# Patient Record
Sex: Male | Born: 1950 | Hispanic: No | Smoking: Never smoker
Health system: Southern US, Community
[De-identification: ages and names within clinical notes are randomized; demographics above are authoritative.]

## PROBLEM LIST (undated history)

## (undated) DIAGNOSIS — F319 Bipolar disorder, unspecified: Secondary | ICD-10-CM

## (undated) DIAGNOSIS — G4733 Obstructive sleep apnea (adult) (pediatric): Secondary | ICD-10-CM

## (undated) DIAGNOSIS — E785 Hyperlipidemia, unspecified: Secondary | ICD-10-CM

## (undated) DIAGNOSIS — R251 Tremor, unspecified: Secondary | ICD-10-CM

## (undated) DIAGNOSIS — E079 Disorder of thyroid, unspecified: Secondary | ICD-10-CM

## (undated) DIAGNOSIS — I10 Essential (primary) hypertension: Secondary | ICD-10-CM

## (undated) HISTORY — DX: Obstructive sleep apnea (adult) (pediatric): G47.33

## (undated) HISTORY — DX: Essential (primary) hypertension: I10

## (undated) HISTORY — DX: Hyperlipidemia, unspecified: E78.5

## (undated) HISTORY — PX: OTOPLASATY: SHX1485

## (undated) HISTORY — DX: Tremor, unspecified: R25.1

## (undated) HISTORY — DX: Bipolar disorder, unspecified: F31.9

## (undated) HISTORY — PX: NASAL SEPTUM SURGERY: SHX37

## (undated) HISTORY — PX: CATARACT EXTRACTION: SUR2

---

## 1969-04-10 HISTORY — PX: HEMORRHOID SURGERY: SHX153

## 2005-12-26 ENCOUNTER — Emergency Department (HOSPITAL_COMMUNITY): Admission: EM | Admit: 2005-12-26 | Discharge: 2005-12-26 | Payer: Self-pay | Admitting: Family Medicine

## 2013-03-08 ENCOUNTER — Other Ambulatory Visit: Payer: Self-pay | Admitting: Family

## 2013-03-08 DIAGNOSIS — M25512 Pain in left shoulder: Secondary | ICD-10-CM

## 2013-03-10 ENCOUNTER — Ambulatory Visit
Admission: RE | Admit: 2013-03-10 | Discharge: 2013-03-10 | Disposition: A | Discharge status: Home / Self Care | Payer: BC Managed Care – PPO | Source: Ambulatory Visit | Attending: Family | Admitting: Family

## 2013-03-10 DIAGNOSIS — M25512 Pain in left shoulder: Secondary | ICD-10-CM

## 2013-11-16 ENCOUNTER — Encounter: Payer: Self-pay | Admitting: *Deleted

## 2013-12-18 IMAGING — CT CT SHOULDER*L* W/O CM
3 of 4 series · 6 of 14 positions shown, 7 images · non-contrast
Comparison: None.

CLINICAL DATA: Shoulder pain with limited range of motion since
falling 4-6 weeks ago.

CT OF THE LEFT SHOULDER WITHOUT CONTRAST
TECHNIQUE: Multidetector CT imaging was performed according to the
standard protocol. Multiplanar CT image reconstructions were also
generated.

[Series 3: shoulder/standard · axial · 0.47mm/px · z∈[-2,+43]mm · 2 of 55 slices shown, 3 images]
[im 19/55  soft-tissue]
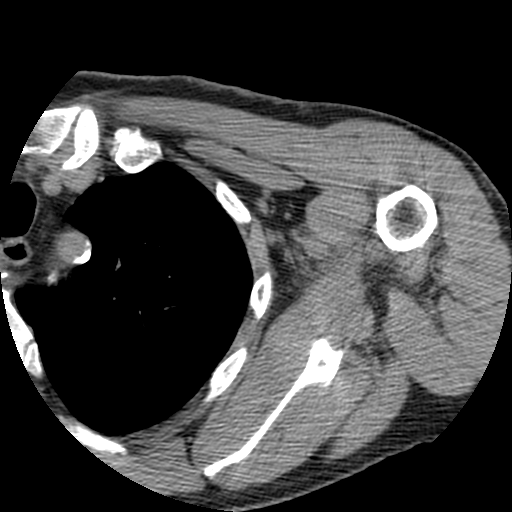
[im 19/55  bone]
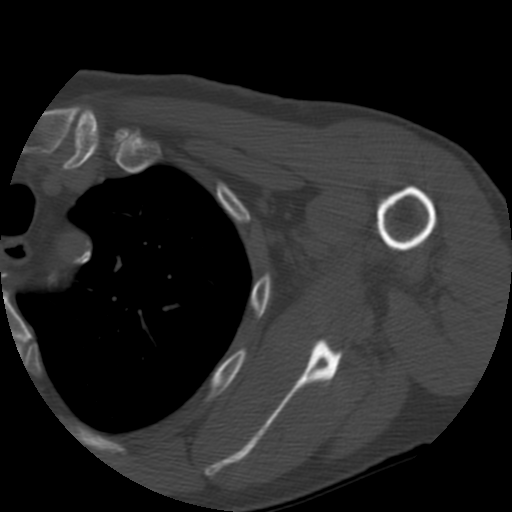
[im 37/55  bone]
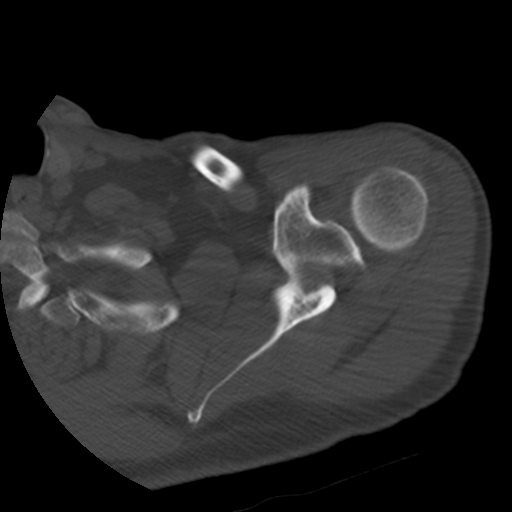

[Series 102: shoulder std · axial · 0.47mm/px · z∈[-2,+43]mm · 2 of 55 slices shown]
[im 19/55  soft-tissue]
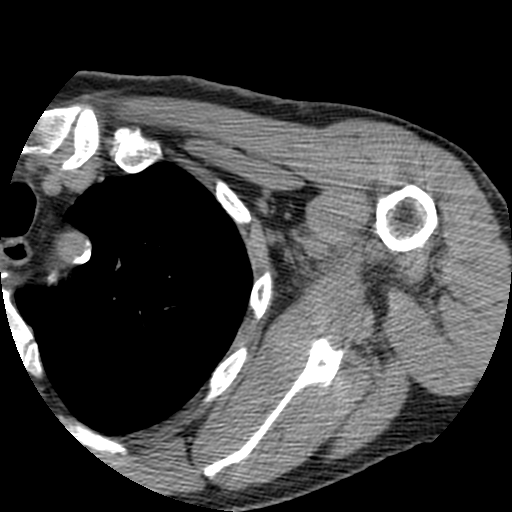
[im 37/55  soft-tissue]
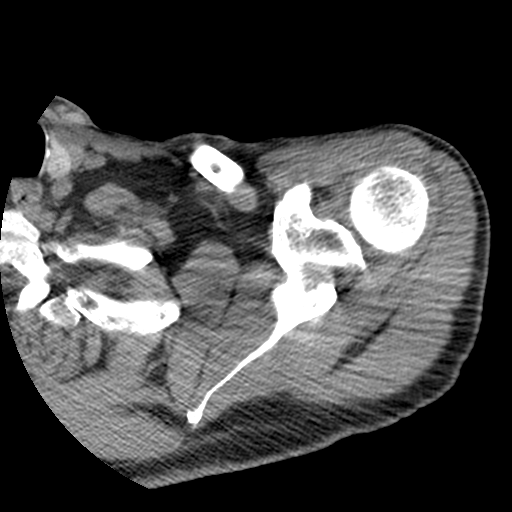

[Series 106: sag soft · coronal · 0.47mm/px · 2 of 62 slices shown]
[im 21/62  soft-tissue]
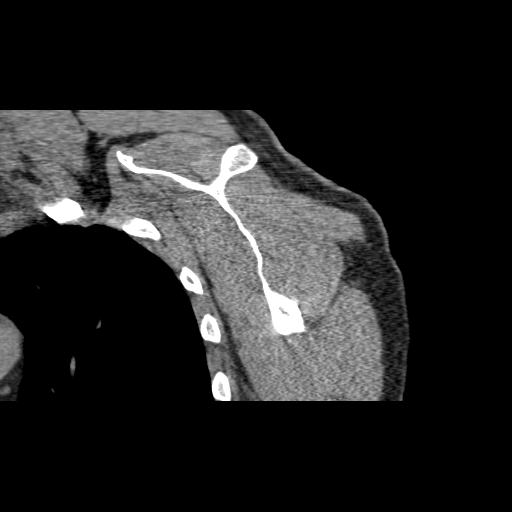
[im 41/62  soft-tissue]
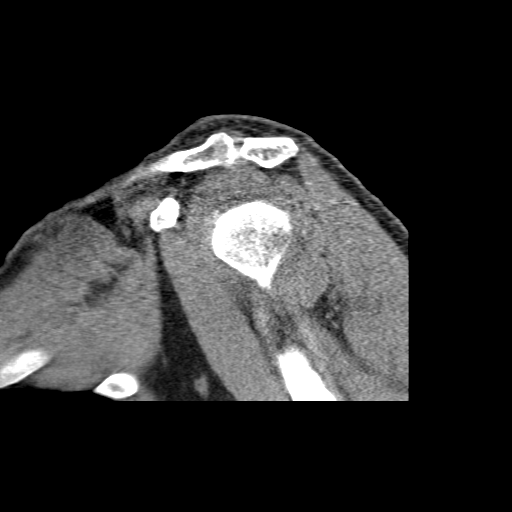

[6 of 14 positions shown; findings below may reference images not displayed]

FINDINGS: There is no evidence of acute fracture or dislocation of
the proximal left humerus, left scapula or left clavicle.  Mild
acromioclavicular degenerative changes are present.  The acromion
has a type 1 shape. The glenohumeral joint appears normal.

No focal atrophy of the rotator cuff musculature is demonstrated.
The cuff itself is suboptimally evaluated by CT.  No periarticular
inflammatory changes are identified.

The visualized left chest wall appears normal.  Emphysematous
changes are noted within the left lung.
IMPRESSION: 1.  No evidence of acute left shoulder fracture or dislocation.
2.  Mild acromioclavicular degenerative changes.
3.  No focal soft tissue abnormalities identified by CT.

## 2018-09-20 ENCOUNTER — Ambulatory Visit (HOSPITAL_COMMUNITY)
Admission: EM | Admit: 2018-09-20 | Discharge: 2018-09-20 | Disposition: A | Discharge status: Home / Self Care | Payer: Medicare Other | Attending: Family Medicine | Admitting: Family Medicine

## 2018-09-20 ENCOUNTER — Ambulatory Visit (INDEPENDENT_AMBULATORY_CARE_PROVIDER_SITE_OTHER): Payer: Medicare Other

## 2018-09-20 ENCOUNTER — Other Ambulatory Visit: Payer: Self-pay

## 2018-09-20 ENCOUNTER — Encounter (HOSPITAL_COMMUNITY): Payer: Self-pay | Admitting: Emergency Medicine

## 2018-09-20 DIAGNOSIS — M109 Gout, unspecified: Secondary | ICD-10-CM

## 2018-09-20 DIAGNOSIS — M79674 Pain in right toe(s): Secondary | ICD-10-CM | POA: Diagnosis not present

## 2018-09-20 HISTORY — DX: Disorder of thyroid, unspecified: E07.9

## 2018-09-20 MED ORDER — PREDNISONE 20 MG PO TABS: 20.0000 mg | ORAL_TABLET | Freq: Two times a day (BID) | ORAL | 0 refills | Status: AC

## 2018-09-20 NOTE — ED Provider Notes (Signed)
[facility]   [id number] 09/20/18 Arrival Time: 0802  CC: Right great toe  SUBJECTIVE: History from: patient. Stephen Zhang is a 68 y.o. male complains of right great toe pain that began a few days ago.  Reports heavy item falling on foot a few weeks ago.  Increased pain, swelling, and redness to the right great toe within the last few days.  Localizes the pain to the right great toe.  Describes the pain as intermittent and throbbing in character.  Has tried aleve with temporary relief.  Symptoms are made worse at night.  Denies similar symptoms in the past.  Denies fever, chills, ecchymosis, weakness, numbness and tingling.      ROS: As per HPI.  Past Medical History:  Diagnosis Date  . Bipolar disorder (HCC)   . Essential hypertension, benign   . OSA (obstructive sleep apnea)   . Other and unspecified hyperlipidemia   . Thyroid disease   . Tremor    Past Surgical History:  Procedure Laterality Date  . CATARACT EXTRACTION    . HEMORRHOID SURGERY  1970's  . NASAL SEPTUM SURGERY    . OTOPLASATY     Allergies  Allergen Reactions  . Abilify [Aripiprazole]    No current facility-administered medications on file prior to encounter.    Current Outpatient Medications on File Prior to Encounter  Medication Sig Dispense Refill  . atorvastatin (LIPITOR) 10 MG tablet Take 10 mg by mouth daily.    . Cholecalciferol (VITAMIN D3 PO) Take by mouth.    . Ferrous Sulfate (IRON PO) Take by mouth.    . levothyroxine (SYNTHROID, LEVOTHROID) 100 MCG tablet Take 20 mcg by mouth daily before breakfast.    . telmisartan (MICARDIS) 40 MG tablet Take 40 mg by mouth daily.    Marland Kitchen olmesartan (BENICAR) 20 MG tablet Take 20 mg by mouth daily.    . tadalafil (CIALIS) 10 MG tablet Take 10 mg by mouth daily as needed for erectile dysfunction.    . triamterene-hydrochlorothiazide (MAXZIDE-25) 37.5-25 MG per tablet Take 1 tablet by mouth daily.     Social History   Socioeconomic History  .  Marital status: Unknown    Spouse name: Not on file  . Number of children: Not on file  . Years of education: Not on file  . Highest education level: Not on file  Occupational History  . Not on file  Social Needs  . Financial resource strain: Not on file  . Food insecurity:    Worry: Not on file    Inability: Not on file  . Transportation needs:    Medical: Not on file    Non-medical: Not on file  Tobacco Use  . Smoking status: Never Smoker  Substance and Sexual Activity  . Alcohol use: Yes  . Drug use: No  . Sexual activity: Not on file  Lifestyle  . Physical activity:    Days per week: Not on file    Minutes per session: Not on file  . Stress: Not on file  Relationships  . Social connections:    Talks on phone: Not on file    Gets together: Not on file    Attends religious service: Not on file    Active member of club or organization: Not on file    Attends meetings of clubs or organizations: Not on file    Relationship status: Not on file  . Intimate partner violence:    Fear of current or ex partner: Not on  file    Emotionally abused: Not on file    Physically abused: Not on file    Forced sexual activity: Not on file  Other Topics Concern  . Not on file  Social History Narrative  . Not on file   Family History  Problem Relation Age of Onset  . Non-Hodgkin's lymphoma Father     OBJECTIVE:  Vitals:   09/20/18 0831  BP: 122/70  Pulse: 65  Resp: 18  Temp: 97.7 F (36.5 C)  TempSrc: Oral  SpO2: 100%    General appearance: Alert; in no acute distress.  Head: NCAT Lungs: Normal respiratory effort CV: Dorsalis pedis pulse intact; cap refill < 2 secs Musculoskeletal: Right great toe Inspection: Obvious erythema and swelling localized to RT MTP and PIP joint of great toe Palpation: TTP over MTP joint ROM: LROM Strength: deferred, due to patient's discomfort Skin: warm and dry Neurologic: Ambulates without difficulty Psychological: alert and  cooperative; normal mood and affect  DIAGNOSTIC STUDIES:  Dg Toe Great Right  Result Date: 09/20/2018 CLINICAL DATA:  Solid object fell on toe EXAM: RIGHT FIRST TOE: 3 V COMPARISON:  None. FINDINGS: Frontal, oblique, and lateral views obtained. No fracture or dislocation. There is mild generalized soft tissue swelling. Joint spaces appear normal. No erosive change. IMPRESSION: Soft tissue swelling. No evident fracture or dislocation. No appreciable arthropathy. Electronically Signed   By: Bretta Bang III M.D.   On: 09/20/2018 09:08     ASSESSMENT & PLAN:  1. 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 predniSONE (DELTASONE) 20 MG tablet    Sig: Take 1 tablet (20 mg total) by mouth 2 (two) times daily with a meal for 5 days.    Dispense:  10 tablet    Refill:  0    Order Specific Question:   Supervising Provider    Answer:   Eustace Moore [[id number]]   X-rays did not show fracture or dislocation Rest, ice, elevate Prescribed prednisone.  Take as directed and to completion Follow up with orthopedist and/ or PCP if symptoms persists Return or go to the ED if you have any new or worsening symptoms fever, chills, redness, swelling, nausea, vomiting, etc...  Reviewed expectations re: course of current medical issues. Questions answered. Outlined signs and symptoms indicating need for more acute intervention. Patient verbalized understanding. After Visit Summary given.    Rennis Harding, PA-C 09/20/18 [phone]

## 2018-09-20 NOTE — ED Triage Notes (Signed)
Patient remembers having a heavy item fall off a shelf and land on foot.  Intermittent pain since then, over the last two days pain has increased significantly.  Pain and swelling.

## 2018-09-20 NOTE — Discharge Instructions (Addendum)
X-rays did not show fracture or dislocation Rest, ice, elevate Prescribed prednisone.  Take as directed and to completion Follow up with orthopedist and/ or PCP if symptoms persists Follow up with PCP for further evaluation and management Return or go to the ED if you have any new or worsening symptoms

## 2019-06-30 IMAGING — DX DG TOE GREAT 2+V*R*
3 series · 3 of 3 positions shown · non-contrast
Comparison: None.

CLINICAL DATA: Solid object fell on toe

EXAM:
RIGHT FIRST TOE: 3 V

[toe ap]
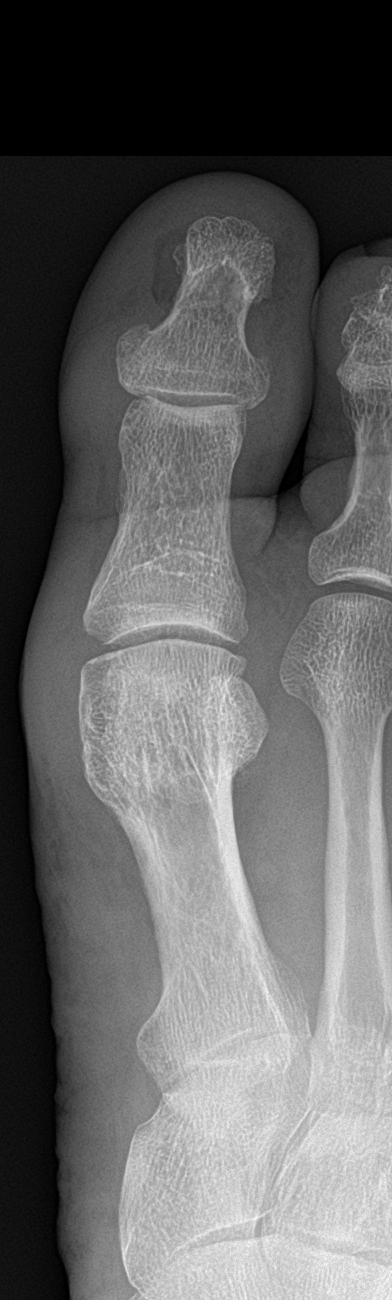

[toe obl]
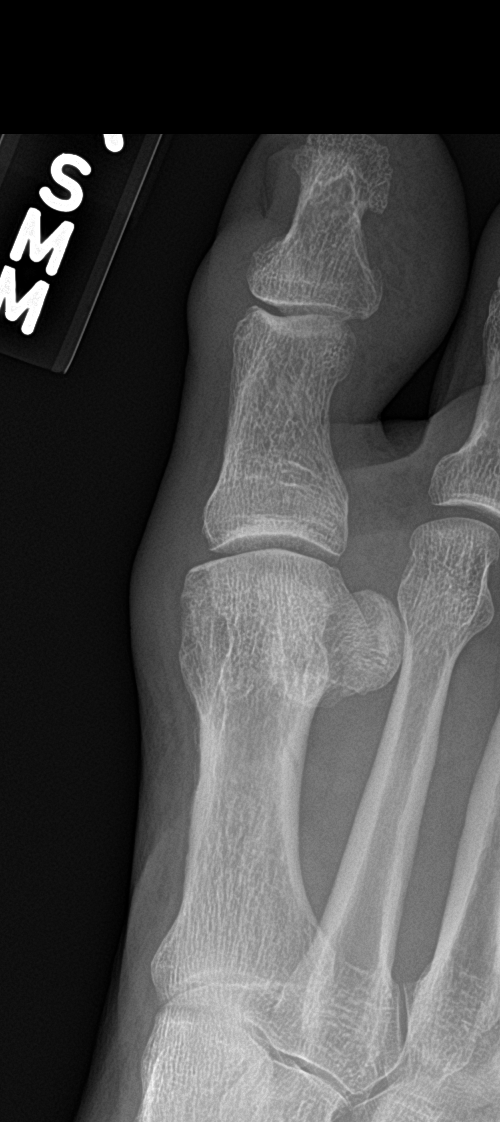

[toe lat]
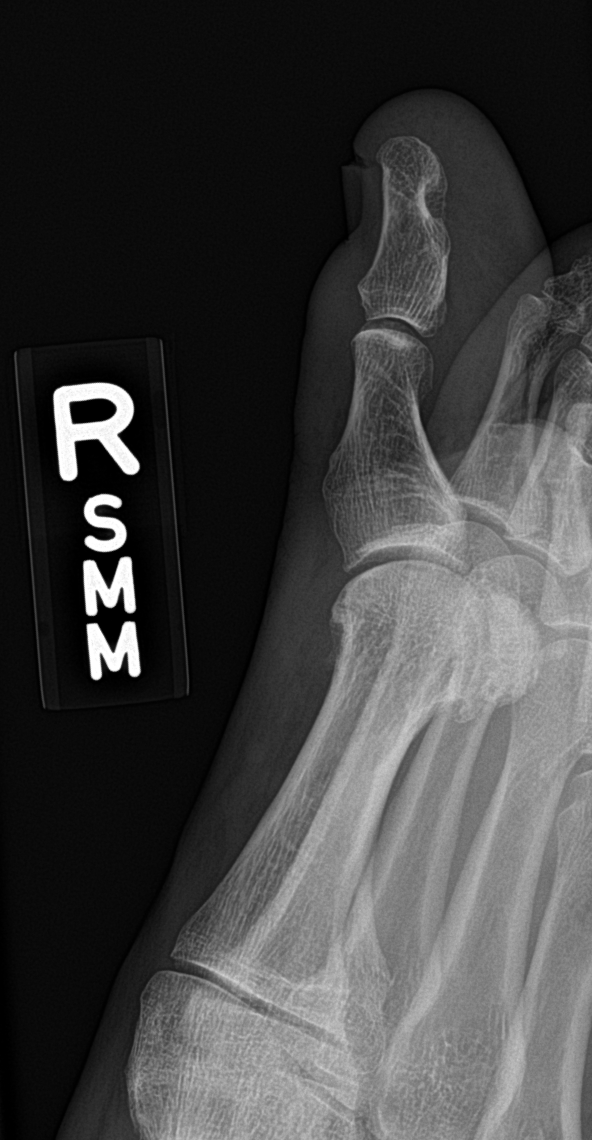

[3 of 3 positions shown; findings below may reference images not displayed]

FINDINGS: Frontal, oblique, and lateral views obtained. No fracture or
dislocation. There is mild generalized soft tissue swelling. Joint
spaces appear normal. No erosive change.
IMPRESSION: Soft tissue swelling. No evident fracture or dislocation. No
appreciable arthropathy.

## 2020-06-02 ENCOUNTER — Encounter: Payer: Self-pay | Admitting: Emergency Medicine

## 2020-06-02 ENCOUNTER — Other Ambulatory Visit: Payer: Self-pay

## 2020-06-02 ENCOUNTER — Ambulatory Visit
Admission: EM | Admit: 2020-06-02 | Discharge: 2020-06-02 | Disposition: A | Discharge status: Home / Self Care | Payer: Medicare Other | Attending: Emergency Medicine | Admitting: Emergency Medicine

## 2020-06-02 DIAGNOSIS — R195 Other fecal abnormalities: Secondary | ICD-10-CM

## 2020-06-02 MED ORDER — LOPERAMIDE HCL 2 MG PO CAPS: 2.0000 mg | ORAL_CAPSULE | Freq: Four times a day (QID) | ORAL | 0 refills | Status: AC | PRN

## 2020-06-02 NOTE — Discharge Instructions (Addendum)
Keep track of stool, bring to specialist. Take immodium as discussed. Read attached handout on food recommendations. Drink plenty of water: avoid sugary beverages.

## 2020-06-02 NOTE — ED Triage Notes (Signed)
Pt here for diarrhea x 1 week that is watery

## 2020-06-02 NOTE — ED Provider Notes (Addendum)
[facility]    CSN: [id number] Arrival date & time: 06/02/20  7829      History   Chief Complaint Chief Complaint  Patient presents with  . Diarrhea    HPI Stephen Zhang is a 69 y.o. male  The medical history as below presenting for loose stools since Monday.  No hematochezia, melena, mucus in stool.  Reporting 2-3 stools daily without rectal pain, hemorrhoids.  No urinary symptoms, fever, arthralgias or myalgias.  No change in diet, lifestyle, medications.  Has not tried thing for this.  Reports good oral intake without vomiting.   Past Medical History:  Diagnosis Date  . Bipolar disorder (HCC)   . Essential hypertension, benign   . OSA (obstructive sleep apnea)   . Other and unspecified hyperlipidemia   . Thyroid disease   . Tremor     There are no problems to display for this patient.   Past Surgical History:  Procedure Laterality Date  . CATARACT EXTRACTION    . HEMORRHOID SURGERY  1970's  . NASAL SEPTUM SURGERY    . OTOPLASATY         Home Medications    Prior to Admission medications   Medication Sig Start Date End Date Taking? Authorizing Provider  atorvastatin (LIPITOR) 10 MG tablet Take 10 mg by mouth daily.    [provider]  Cholecalciferol (VITAMIN D3 PO) Take by mouth.    [provider]  Ferrous Sulfate (IRON PO) Take by mouth.    [provider]  levothyroxine (SYNTHROID, LEVOTHROID) 100 MCG tablet Take 20 mcg by mouth daily before breakfast.    [provider]  loperamide (IMODIUM) 2 MG capsule Take 1 capsule (2 mg total) by mouth 4 (four) times daily as needed for diarrhea or loose stools. 06/02/20   Hall-Potvin, [place], PA-C  olmesartan (BENICAR) 20 MG tablet Take 20 mg by mouth daily.    [provider]  tadalafil (CIALIS) 10 MG tablet Take 10 mg by mouth daily as needed for erectile dysfunction.    [provider]  telmisartan (MICARDIS) 40 MG tablet Take 40 mg by mouth  daily.    [provider]  triamterene-hydrochlorothiazide (MAXZIDE-25) 37.5-25 MG per tablet Take 1 tablet by mouth daily.    [provider]    Family History Family History  Problem Relation Age of Onset  . Non-Hodgkin's lymphoma Father     Social History Social History   Tobacco Use  . Smoking status: Never Smoker  Substance Use Topics  . Alcohol use: Yes  . Drug use: No     Allergies   Abilify [aripiprazole]   Review of Systems Review of Systems  Constitutional: Negative for fever.  Respiratory: Negative for cough and shortness of breath.   Cardiovascular: Negative for chest pain and palpitations.  Gastrointestinal: Positive for diarrhea. Negative for abdominal pain, blood in stool, constipation, heartburn, melena, nausea and vomiting.  Genitourinary: Negative for dysuria.  Neurological: Negative for weakness.  All other systems reviewed and are negative.    Physical Exam Triage Vital Signs ED Triage Vitals  Enc Vitals Group     BP      Pulse      Resp      Temp      Temp src      SpO2      Weight      Height      Head Circumference      Peak Flow  Pain Score      Pain Loc      Pain Edu?      Excl. in GC?    No data found.  Updated Vital Signs BP 115/70 (BP Location: Left Arm)   Pulse 63   Temp 97.8 F (36.6 C) (Oral)   Resp 18   SpO2 97%   Visual Acuity Right Eye Distance:   Left Eye Distance:   Bilateral Distance:    Right Eye Near:   Left Eye Near:    Bilateral Near:     Physical Exam Constitutional:      General: He is not in acute distress. HENT:     Head: Normocephalic and atraumatic.  Eyes:     General: No scleral icterus.    Pupils: Pupils are equal, round, and reactive to light.  Cardiovascular:     Rate and Rhythm: Normal rate.  Pulmonary:     Effort: Pulmonary effort is normal. No respiratory distress.     Breath sounds: No wheezing.  Abdominal:     General: Abdomen is flat. Bowel sounds are  normal. There is no distension or abdominal bruit.     Palpations: Abdomen is soft. There is no hepatomegaly or splenomegaly.     Tenderness: There is no abdominal tenderness. There is no guarding or rebound. Negative signs include Murphy's sign, Rovsing's sign and McBurney's sign.     Hernia: No hernia is present.  Skin:    Coloration: Skin is not jaundiced or pale.  Neurological:     Mental Status: He is alert and oriented to person, place, and time.      UC Treatments / Results  Labs (all labs ordered are listed, but only abnormal results are displayed) Labs Reviewed - No data to display  EKG   Radiology No results found.  Procedures Procedures (including critical care time)  Medications Ordered in UC Medications - No data to display  Initial Impression / Assessment and Plan / UC Course  I have reviewed the triage vital signs and the nursing notes.  Pertinent labs & imaging results that were available during my care of the patient were reviewed by me and considered in my medical decision making (see chart for details).     Well-hydrated and hemodynamically stable in office.  Having fewer than 6 BMs daily and has not tried conservative treatment.  Does not meet up-to-date guidelines for stool study.  Will start Imodium, follow-up with GI as needed.  Return precautions discussed, pt verbalized understanding and is agreeable to plan. Final Clinical Impressions(s) / UC Diagnoses   Final diagnoses:  Watery stools     Discharge Instructions     Keep track of stool, bring to specialist. Take immodium as discussed. Read attached handout on food recommendations. Drink plenty of water: avoid sugary beverages.    ED Prescriptions    Medication Sig Dispense Auth. Provider   loperamide (IMODIUM) 2 MG capsule Take 1 capsule (2 mg total) by mouth 4 (four) times daily as needed for diarrhea or loose stools. 12 capsule Hall-Potvin, [place], PA-C     PDMP not reviewed this  encounter.     Hall-Potvin, [place], [place] 06/02/20 1034

## 2021-08-05 ENCOUNTER — Emergency Department (HOSPITAL_COMMUNITY)
Admission: EM | Admit: 2021-08-05 | Discharge: 2021-08-05 | Disposition: A | Discharge status: Home / Self Care | Payer: Medicare Other | Attending: Emergency Medicine | Admitting: Emergency Medicine

## 2021-08-05 ENCOUNTER — Other Ambulatory Visit: Payer: Self-pay

## 2021-08-05 DIAGNOSIS — H9202 Otalgia, left ear: Secondary | ICD-10-CM | POA: Insufficient documentation

## 2021-08-05 DIAGNOSIS — H60502 Unspecified acute noninfective otitis externa, left ear: Secondary | ICD-10-CM

## 2021-08-05 DIAGNOSIS — I10 Essential (primary) hypertension: Secondary | ICD-10-CM | POA: Insufficient documentation

## 2021-08-05 DIAGNOSIS — Z79899 Other long term (current) drug therapy: Secondary | ICD-10-CM | POA: Insufficient documentation

## 2021-08-05 MED ORDER — CIPROFLOXACIN-DEXAMETHASONE 0.3-0.1 % OT SUSP: 4.0000 [drp] | Freq: Two times a day (BID) | OTIC | 0 refills | Status: AC

## 2021-08-05 NOTE — ED Provider Notes (Signed)
[facility] EMERGENCY DEPARTMENT Provider Note   CSN: [id number] Arrival date & time: 08/05/21  1137     History Chief Complaint  Patient presents with   Cerumen Impaction    Stephen Zhang is a 70 y.o. male with concern for left ear being clogged x3 days.  States he can hardly hear out of it.  States he has been pouring peroxide and water using Q-tips to try to scrape through to get wax out of the ear unsuccessfully.  States he has a ENT appointment for the same problem but is not till mid January.  He endorses some pain and now new yellow watery discharge from the left ear.  I personally reviewed this patient's medical records.  Has history of hypertension, sleep apnea, bipolar disease and hypothyroidism.  He is not on any anticoagulation.   HPI     Past Medical History:  Diagnosis Date   Bipolar disorder (HCC)    Essential hypertension, benign    OSA (obstructive sleep apnea)    Other and unspecified hyperlipidemia    Thyroid disease    Tremor     There are no problems to display for this patient.   Past Surgical History:  Procedure Laterality Date   CATARACT EXTRACTION     HEMORRHOID SURGERY  1970's   NASAL SEPTUM SURGERY     OTOPLASATY         Family History  Problem Relation Age of Onset   Non-Hodgkin's lymphoma Father     Social History   Tobacco Use   Smoking status: Never  Substance Use Topics   Alcohol use: Yes   Drug use: No    Home Medications Prior to Admission medications   Medication Sig Start Date End Date Taking? Authorizing Provider  atorvastatin (LIPITOR) 10 MG tablet Take 10 mg by mouth daily.    [provider]  Cholecalciferol (VITAMIN D3 PO) Take by mouth.    [provider]  Ferrous Sulfate (IRON PO) Take by mouth.    [provider]  levothyroxine (SYNTHROID, LEVOTHROID) 100 MCG tablet Take 20 mcg by mouth daily before breakfast.    [provider]  loperamide (IMODIUM) 2 MG  capsule Take 1 capsule (2 mg total) by mouth 4 (four) times daily as needed for diarrhea or loose stools. 06/02/20   Hall-Potvin, [place], PA-C  olmesartan (BENICAR) 20 MG tablet Take 20 mg by mouth daily.    [provider]  tadalafil (CIALIS) 10 MG tablet Take 10 mg by mouth daily as needed for erectile dysfunction.    [provider]  telmisartan (MICARDIS) 40 MG tablet Take 40 mg by mouth daily.    [provider]  triamterene-hydrochlorothiazide (MAXZIDE-25) 37.5-25 MG per tablet Take 1 tablet by mouth daily.    [provider]    Allergies    Abilify [aripiprazole]  Review of Systems   Review of Systems  Constitutional: Negative.   HENT:  Positive for ear discharge, ear pain and hearing loss. Negative for congestion, dental problem, drooling, facial swelling, sore throat and trouble swallowing.   Eyes: Negative.   Respiratory: Negative.    Cardiovascular: Negative.   Gastrointestinal: Negative.   Genitourinary: Negative.   Musculoskeletal: Negative.   Skin: Negative.   Neurological: Negative.   Hematological: Negative.    Physical Exam Updated Vital Signs BP (!) 149/84    Pulse 61    Temp 98.1 F (36.7 C) (Oral)    Resp 16    SpO2  99%   Physical Exam Vitals and nursing note reviewed.  Constitutional:      Appearance: He is not ill-appearing or toxic-appearing.  HENT:     Head: Normocephalic and atraumatic.     Right Ear: Tympanic membrane normal.     Ears:     Comments: External auditory canal is edematous, erythematous, with watery yellow discharge, canal swollen shut, unable to visualize TM due to canal edema. Normal.  Cardiopulmonary normal.  Cardiopulmonary cardiopulmonary appointment next month.  [name]  [place]ball voiced understanding of his medical evaluation and treatment plan.    Nose: Nose normal. No congestion.     Mouth/Throat:     Mouth: Mucous membranes are moist.     Pharynx: No oropharyngeal exudate or posterior  oropharyngeal erythema.  Eyes:     General:        Right eye: No discharge.        Left eye: No discharge.     Extraocular Movements: Extraocular movements intact.     Conjunctiva/sclera: Conjunctivae normal.     Pupils: Pupils are equal, round, and reactive to light.  Cardiovascular:     Rate and Rhythm: Normal rate and regular rhythm.     Pulses: Normal pulses.     Heart sounds: Normal heart sounds. No murmur heard. Pulmonary:     Effort: Pulmonary effort is normal. No respiratory distress.     Breath sounds: Normal breath sounds. No wheezing or rales.  Abdominal:     General: Bowel sounds are normal. There is no distension.     Palpations: Abdomen is soft.     Tenderness: There is no abdominal tenderness. There is no right CVA tenderness, left CVA tenderness, guarding or rebound.  Musculoskeletal:        General: No deformity.     Cervical back: Neck supple. No tenderness.  Lymphadenopathy:     Cervical: No cervical adenopathy.  Skin:    General: Skin is warm and dry.     Capillary Refill: Capillary refill takes less than 2 seconds.  Neurological:     General: No focal deficit present.     Mental Status: He is alert and oriented to person, place, and time. Mental status is at baseline.  Psychiatric:        Mood and Affect: Mood normal.    ED Results / Procedures / Treatments   Labs (all labs ordered are listed, but only abnormal results are displayed) Labs Reviewed - No data to display  EKG None  Radiology No results found.  Procedures Procedures   Medications Ordered in ED Medications - No data to display  ED Course  I have reviewed the triage vital signs and the nursing notes.  Pertinent labs & imaging results that were available during my care of the patient were reviewed by me and considered in my medical decision making (see chart for details).    MDM Rules/Calculators/A&P                         70 year old male who presents with concern for left  ear discomfort and muffled hearing.  Hypertension on intake, vital signs otherwise normal. Cardiopulmonary exam is normal, abdominal exam is benign.  Ear exam consistent with otitis externa on the left with canal edema, erythema, and drainage.  No further work-up warranted and there is time.  Ciprodex drops prescribed.  Outpatient follow-up recommended.  Patient does have established ENT. Cutberto voiced understanding of his medical evaluation and  treatment plan. Each of his questions was answered to his expressed satisfaction., Return precautions given. Patient is well-appearing, stable and appropriate for discharge at this time.   This chart was dictated using voice recognition software, Dragon. Despite the best efforts of this provider to proofread and correct errors, errors may still occur which can change documentation meaning.     Final Clinical Impression(s) / ED Diagnoses Final diagnoses:  None    Rx / DC Orders ED Discharge Orders     None        Sherrilee Gilles 08/05/21 2301    Pollyann Savoy, MD 08/06/21 [phone]

## 2021-08-05 NOTE — Discharge Instructions (Addendum)
You are seen in the ER today for your ear discomfort and difficulty hearing.  You are found of otitis externa which is an ear infection of the skin of your canal.  For this reason you have been prescribed antibiotic eardrops.  Please use them as prescribed entire course and follow-up with your primary care doctor.  Return to the ER with any severe symptoms.

## 2021-08-05 NOTE — ED Triage Notes (Signed)
Pt reports L ear being clogged with wax since Saturday. Multiple unsuccessful attempts to clean it out. Reports it drains waxy liquid at night and is unable to hear out of that ear.
